# Patient Record
Sex: Male | Born: 1960 | Marital: Married | Smoking: Never smoker
Health system: Southern US, Community
[De-identification: ages and names within clinical notes are randomized; demographics above are authoritative.]

## PROBLEM LIST (undated history)

## (undated) HISTORY — PX: CARPAL TUNNEL RELEASE: SHX101

---

## 2015-08-31 ENCOUNTER — Other Ambulatory Visit: Payer: Self-pay | Admitting: Internal Medicine

## 2015-08-31 DIAGNOSIS — R350 Frequency of micturition: Secondary | ICD-10-CM

## 2015-09-06 ENCOUNTER — Ambulatory Visit
Admission: RE | Admit: 2015-09-06 | Discharge: 2015-09-06 | Disposition: A | Discharge status: Home / Self Care | Payer: BLUE CROSS/BLUE SHIELD | Source: Ambulatory Visit | Attending: Internal Medicine | Admitting: Internal Medicine

## 2015-09-06 DIAGNOSIS — R350 Frequency of micturition: Secondary | ICD-10-CM

## 2015-09-27 ENCOUNTER — Ambulatory Visit (INDEPENDENT_AMBULATORY_CARE_PROVIDER_SITE_OTHER): Payer: BLUE CROSS/BLUE SHIELD | Admitting: Neurology

## 2015-09-27 ENCOUNTER — Encounter: Payer: Self-pay | Admitting: Neurology

## 2015-09-27 VITALS — BP 128/68 | HR 77

## 2015-09-27 DIAGNOSIS — G5603 Carpal tunnel syndrome, bilateral upper limbs: Secondary | ICD-10-CM | POA: Diagnosis not present

## 2015-09-27 DIAGNOSIS — H538 Other visual disturbances: Secondary | ICD-10-CM

## 2015-09-27 DIAGNOSIS — G609 Hereditary and idiopathic neuropathy, unspecified: Secondary | ICD-10-CM

## 2015-09-27 DIAGNOSIS — G114 Hereditary spastic paraplegia: Secondary | ICD-10-CM

## 2015-09-27 NOTE — Progress Notes (Signed)
Chart forwarded.  

## 2015-09-27 NOTE — Progress Notes (Signed)
NEUROLOGY CONSULTATION NOTE  Richmond Coldren MRN: [id number] DOB: Feb 15, 1961  Referring provider: Dr. Nehemiah Settle Primary care provider: Dr. Nehemiah Settle  Reason for consult:  Hereditary spastic paraparesis  HISTORY OF PRESENT ILLNESS: Mitchell Reyes is a 55 year old right-handed man  who presents for autosomal dominant hereditary spastic paraparesis.  History obtained by patient and prior neurologist's notes.  His symptoms started when he was in his late-20s.  He was living in [place] at the time.  At first, he would occasionally limp or feel unsteady while walking.  Then the symptoms gradually worsened over the years with increased pain and stiffness in the legs.  In 2006, he started having trouble running and walking long distances.  In 2009, he was evaluated by an orthopedist, who noted clonus in his lower extremities.  He was referred to a neurologist in [place].  MRI of the brain and spine revealed no abnormalities except for some mild degenerative changes.  He began using a cane around this time, and then progressed to a walker in the house and use of a wheelchair outside.  He has a family history discussed below, on his maternal side, with similar symptoms.  He was referred to the [facility] in 2010.  Due to financial reasons, he did not have genetic testing performed, but based on family history, hereditary spastic paraparesis was suspected.  Later, he apparently had genetic testing done but never received any results.  He has severe bilateral CTS, as demonstrated on prior NCV-EMG, and has undergone release on the left.  He continues to have grip weakness but no pain or numbness. He also has blurred vision, which has progressively gotten worse over the past couple of years.  He saw an ophthalmologist who didn't find any abnormalities. He also has neuropathy. He has chronic back pain with some involvement in the legs.  Lumbar X-rays from 2009 showed chronic right L1 transverse process  fracture. He has urinary urgency. He does not have anal sphincter dysfunction or erectile dysfunction.  He has no history of seizures.  He takes gabapentin  in AM and afternoon, and  at bedtime for pain. He takes baclofen  five times daily and  at bedtime for spasticity.  He previously took tizanidine, which caused excessive drowsiness.Marland Kitchen He takes oxybutynin  twice daily for urinary urgency.  Labs from 2012 include SPEP and IFE negative, vitamin D 50, vitamin E 3.2, copper 93, TSH 2, B12 1444, methylmalonic acid 0.14, HGB A1c 5.7.  Repeat B12 from 2015 was 366.  Family history:  His mother was diagnosed with HSP, first developing symptoms in her 88s.  She has tested negative for mutations in the SPAST gene.  His mother's twin sister also was diagnosed with HSP, presenting symptoms in her 86s.  His mother's 3 other siblings were not known to have the disease.  His maternal uncle, who passed away in his 3s, was presumed to have had the disease.  His maternal grandfather who died in his 92s was suspected to have had the disease, presenting with symptoms in his 69s.  He has three daughters.  His middle daughter has spina bifida and cerebral palsy.33  PAST MEDICAL HISTORY: History reviewed. No pertinent past medical history.  PAST SURGICAL HISTORY: Past Surgical History  Procedure Laterality Date  . Carpal tunnel release Bilateral     MEDICATIONS: Baclofen  every 4 hours and  at bedtime Gabapentin  twice daily and  at bedime Oxybutynin  daily Claritin  daily  ALLERGIES:  No Known Allergies  FAMILY HISTORY: Family History  Problem Relation Age of Onset  . Stroke Maternal Grandfather     SOCIAL HISTORY: Social History   Social History  . Marital Status: Married    Spouse Name: N/A  . Number of Children: N/A  . Years of Education: N/A   Occupational History  . Not on file.   Social History Main Topics  . Smoking status: Never  Smoker   . Smokeless tobacco: Not on file  . Alcohol Use: Not on file  . Drug Use: Not on file  . Sexual Activity: Not on file   Other Topics Concern  . Not on file   Social History Narrative  . No narrative on file    REVIEW OF SYSTEMS: Constitutional: No fevers, chills, or sweats, no generalized fatigue, change in appetite Eyes: No visual changes, double vision, eye pain Ear, nose and throat: No hearing loss, ear pain, nasal congestion, sore throat Cardiovascular: No chest pain, palpitations Respiratory:  No shortness of breath at rest or with exertion, wheezes GastrointestinaI: No nausea, vomiting, diarrhea, abdominal pain, fecal incontinence Genitourinary:  urinary frequency Musculoskeletal:   back pain Integumentary: No rash, pruritus, skin lesions Neurological: as above Psychiatric: No depression, insomnia, anxiety Endocrine: No palpitations, fatigue, diaphoresis, mood swings, change in appetite, change in weight, increased thirst Hematologic/Lymphatic:  No purpura, petechiae. Allergic/Immunologic: no itchy/runny eyes, nasal congestion, recent allergic reactions, rashes  PHYSICAL EXAM: Filed Vitals:   09/27/15 0957  BP: 128/68  Pulse: 77   General: No acute distress.  Patient appears well-groomed.  Head:  Normocephalic/atraumatic Eyes:  fundi examined but not visualized Neck: supple, no paraspinal tenderness, full range of motion Back: No paraspinal tenderness Heart: regular rate and rhythm Lungs: Clear to auscultation bilaterally. Vascular: No carotid bruits. Neurological Exam: Mental status: alert and oriented to person, place, and time, recent and remote memory intact, fund of knowledge intact, attention and concentration intact, speech fluent and not dysarthric, language intact. Cranial nerves: CN I: not tested CN II: pupils equal, round and reactive to light, visual fields intact CN III, IV, VI:  full range of motion, no nystagmus, no ptosis CN V: facial  sensation intact CN VII: upper and lower face symmetric CN VIII: hearing intact CN IX, X: gag intact, uvula midline CN XI: sternocleidomastoid and trapezius muscles intact CN XII: tongue midline Bulk & Tone: Mild increased tone in lower extremities.  Bilateral APB wasting in hands, no fasciculations. Motor:  3+ bilateral hip flexion, 4+ bilateral hamstrings, 4+ left quads, and bilateral foot drop.  Otherwise, 5/5 throughout. Sensation:  Pinprick sensation and vibration sensation in feet. Deep Tendon Reflexes:  1+ in upper extremities, 2+ lower extremities with non-sustained clonus,  Bilateral Babinski Finger to nose testing:  Without dysmetria.  Heel to shin:  Unable to perform Gait:  Able to push self up from wheelchair to stand.  Wide-based stance.  Spastic gait.  IMPRESSION: 1.  Probable hereditary spastic paraparesis, given family history.  Other considerations, such as PLS or HTLV-1 associated myelopathy less likely, given this strong family history.  Prior MRIs reportedly negative for MS.  However, a definitive diagnosis cannot be made without genetic testing.  2.  Peripheral neuropathy.  Idiopathic but may be seen in some cases of HSP. 3.  Blurred vision  PLAN: 1.  Continue baclofen and gabapentin 2.  Will refer to ophthalmology regarding progressive vision loss 3.  Has appointment with urology regarding urinary frequency. 4.  Since neuromuscular diseases may cause  cardiomyopathy, will refer for cardiac evaluation. 5.  For a definitive diagnosis, we will look into genetic testing and what the potential cost would be.  Thank you for allowing me to take part in the care of this patient.  Shon MilletAdam Jaffe, DO  CC: Renford Dillsonald Polite, MD

## 2015-09-27 NOTE — Patient Instructions (Signed)
1.  We will refer you to ophthalmology for blurred vision 2.  We will refer you to cardiology for routine evaluation as people with neuromuscular disorders may develop heart issues 3.  We will look into genetic testing. 4.  Follow up in 1 year or as needed.

## 2015-09-28 ENCOUNTER — Telehealth: Payer: Self-pay

## 2015-09-28 NOTE — Telephone Encounter (Signed)
  Called insurance. No coverage for genetic testing for pt's over 53 years old. Can fax in a letter of medical necessity, but not likely to be accepted.     [email or web address]

## 2015-10-02 ENCOUNTER — Encounter: Payer: Self-pay | Admitting: Cardiovascular Disease

## 2015-10-02 ENCOUNTER — Ambulatory Visit (INDEPENDENT_AMBULATORY_CARE_PROVIDER_SITE_OTHER): Payer: BLUE CROSS/BLUE SHIELD | Admitting: Cardiovascular Disease

## 2015-10-02 VITALS — BP 110/78 | HR 77 | Ht 71.0 in | Wt 294.0 lb

## 2015-10-02 DIAGNOSIS — R0600 Dyspnea, unspecified: Secondary | ICD-10-CM

## 2015-10-02 NOTE — Progress Notes (Signed)
Cardiology Office Note   Date:  10/02/2015   ID:  Mitchell Reyes, DOB 12-09-1960, MRN [id number]  PCP:  Katy Apo, MD  Cardiologist:   Lorine Bears, MD   Chief Complaint  Patient presents with  . New Evaluation    Hereditary spastic paraparesis  . Edema    LEGS AND ANKLES      History of Present Illness: Mitchell Reyes is a 55 y.o. male who was referred by Dr. Everlena Cooper for cardiovascular evaluation.  The patient has a rare autosomal dominant hereditary paraparesis which is a neuromuscular disorder was diagnosed in 2009. He is mostly wheelchair bound due to this and he is able to use a walker. This condition can be associated with cardiomyopathy and thus he was referred for cardiac evaluation. He denies any chest pain, palpitations, dizziness or syncope. He does complain of mild exertional dyspnea with no recent worsening. He is not a smoker and does not. There is no family history of coronary artery disease, heart failure or cardiomyopathy.   History reviewed. No pertinent past medical history.  Past Surgical History  Procedure Laterality Date  . Carpal tunnel release Bilateral      Current Outpatient Prescriptions  Medication Sig Dispense Refill  . baclofen (LIORESAL) 20 MG tablet Take 20 mg by mouth every 4 (four) hours. Five during the day and 2 at bedtime    . gabapentin (NEURONTIN) 800 MG tablet Take 800 mg by mouth See admin instructions. BID and 1600 at bedtime    . loratadine (CLARITIN) 10 MG tablet Take 10 mg by mouth daily.    Marland Kitchen oxybutynin (DITROPAN) 5 MG tablet Take 10 mg by mouth 2 (two) times daily.     No current facility-administered medications for this visit.    Allergies:   Review of patient's allergies indicates no known allergies.    Social History:  The patient  reports that he has never smoked. He does not have any smokeless tobacco history on file.   Family History:  The patient's family history includes Stroke in his maternal grandfather.     ROS:  Please see the history of present illness.   Otherwise, review of systems are positive for none.   All other systems are reviewed and negative.    PHYSICAL EXAM: VS:  BP 110/78 mmHg  Pulse 77  Ht  (1.803 m)  Wt 294 lb (133.358 kg)  BMI 41.02 kg/m2 , BMI Body mass index is 41.02 kg/(m^2). GEN: Well nourished, well developed, in no acute distress HEENT: normal Neck: no JVD, carotid bruits, or masses Cardiac: RRR; no murmurs, rubs, or gallops,no edema  Respiratory:  clear to auscultation bilaterally, normal work of breathing GI: soft, nontender, nondistended, + BS MS: no deformity or atrophy Skin: warm and dry, no rash Neuro:  Strength and sensation are intact Psych: euthymic mood, full affect   EKG:  EKG is ordered today. The ekg ordered today demonstrates normal sinus rhythm. No evidence of a MI.   Recent Labs: No results found for requested labs within last 365 days.    Lipid Panel No results found for: CHOL, TRIG, HDL, CHOLHDL, VLDL, LDLCALC, LDLDIRECT    Wt Readings from Last 3 Encounters:  10/02/15 294 lb (133.358 kg)        ASSESSMENT AND PLAN:  1.  Mild exertional dyspnea: History of hereditary spastic paraparesis which can be associated with cardiomyopathy. His cardiac exam is unremarkable and baseline ECG is normal. I requested an echocardiogram to evaluate  LV systolic function. The patient otherwise has no anginal symptoms. He can follow-up as needed if cardiac testing is abnormal.    Disposition:   FU with me prn.   Signed,  Lorine BearsMuhammad Arida, MD  10/02/2015 10:39 AM    [facility]

## 2015-10-02 NOTE — Patient Instructions (Signed)
Medication Instructions:  Your physician recommends that you continue on your current medications as directed. Please refer to the Current Medication list given to you today.  Labwork: No new orders.   Testing/Procedures: Your physician has requested that you have an echocardiogram. Echocardiography is a painless test that uses sound waves to create images of your heart. It provides your doctor with information about the size and shape of your heart and how well your heart's chambers and valves are working. This procedure takes approximately one hour. There are no restrictions for this procedure.  Follow-Up: Your physician recommends that you schedule a follow-up appointment as needed with Dr Arida.    Any Other Special Instructions Will Be Listed Below (If Applicable).     If you need a refill on your cardiac medications before your next appointment, please call your pharmacy.   

## 2015-10-03 NOTE — Telephone Encounter (Signed)
Okay.  No further recommendations at this time.

## 2015-10-22 ENCOUNTER — Other Ambulatory Visit (HOSPITAL_COMMUNITY): Payer: BLUE CROSS/BLUE SHIELD

## 2015-11-09 ENCOUNTER — Telehealth: Payer: Self-pay | Admitting: Cardiovascular Disease

## 2015-11-09 NOTE — Telephone Encounter (Signed)
I spoke with the pt and made him aware that Echo was recommended to evaluate for cardiomyopathy.  Due to financial issues the pt would like to reschedule this test to September.  Echo rescheduled to 01/15/16.

## 2015-11-09 NOTE — Telephone Encounter (Signed)
New message     Patient is schedule for echo on  7.10 . Wants to know why he needs to have echo . $1500.00 he is responsible for out of pocket,.

## 2015-11-12 ENCOUNTER — Other Ambulatory Visit (HOSPITAL_COMMUNITY): Payer: BLUE CROSS/BLUE SHIELD

## 2016-01-11 ENCOUNTER — Telehealth: Payer: Self-pay | Admitting: *Deleted

## 2016-01-11 NOTE — Telephone Encounter (Signed)
Per [name],  Echocardiogram--- Cancel Rsn: Patient (patient does not want to cancel appt - moving out of the area )

## 2016-01-15 ENCOUNTER — Other Ambulatory Visit (HOSPITAL_COMMUNITY): Payer: BLUE CROSS/BLUE SHIELD

## 2016-02-08 ENCOUNTER — Encounter: Payer: Self-pay | Admitting: Neurology

## 2016-09-26 ENCOUNTER — Ambulatory Visit: Payer: BLUE CROSS/BLUE SHIELD | Admitting: Neurology

## 2016-09-30 ENCOUNTER — Ambulatory Visit: Payer: BLUE CROSS/BLUE SHIELD | Admitting: Neurology

## 2016-10-06 ENCOUNTER — Encounter: Payer: Self-pay | Admitting: Neurology

## 2017-12-07 IMAGING — US US PELVIS LIMITED
1 series · 14 of 25 positions shown · non-contrast
Comparison: None.

CLINICAL DATA: Urinary frequency and urgency, some pelvic pain

EXAM:
LIMITED ULTRASOUND OF PELVIS
TECHNIQUE: Limited transabdominal ultrasound examination of the pelvis was
performed.

[Series 1: us pelvis limited · 0.30mm/px · 14 of 31 slices shown]
[im 1/31]
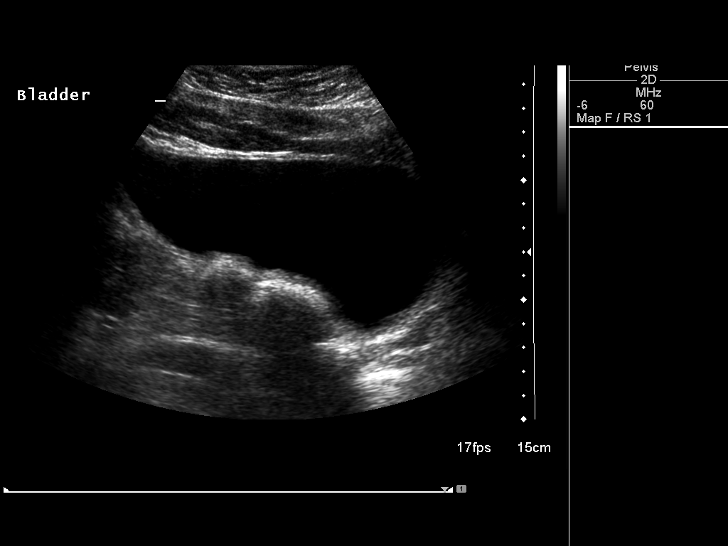
[im 3/31]
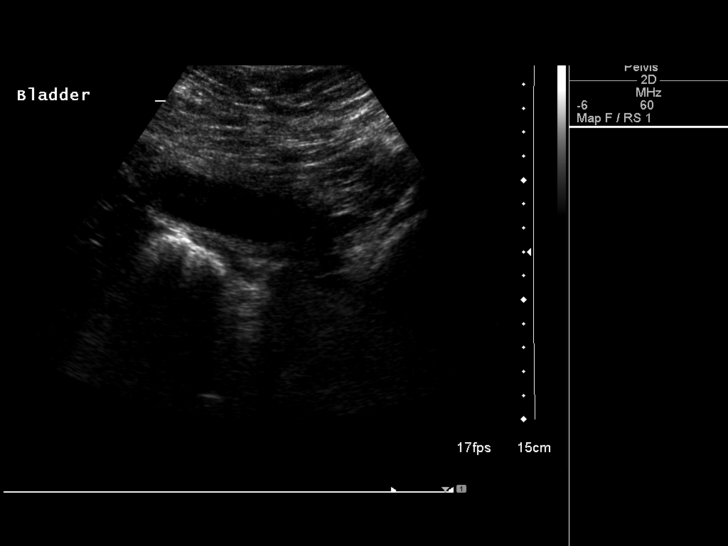
[im 6/31]
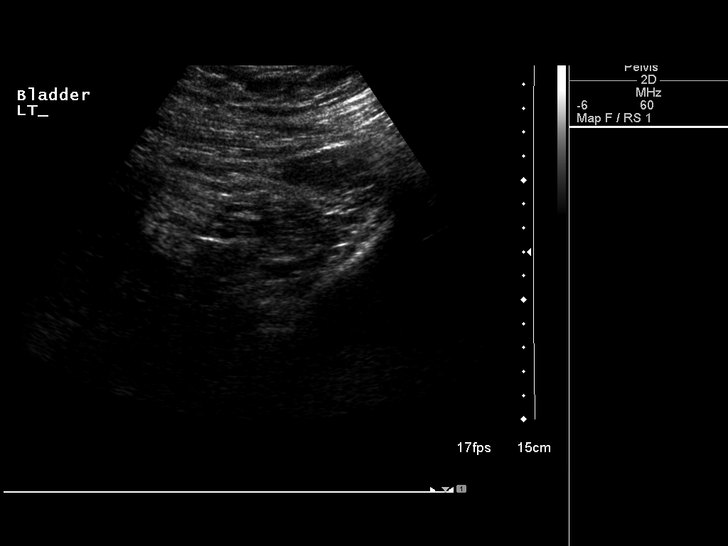
[im 8/31]
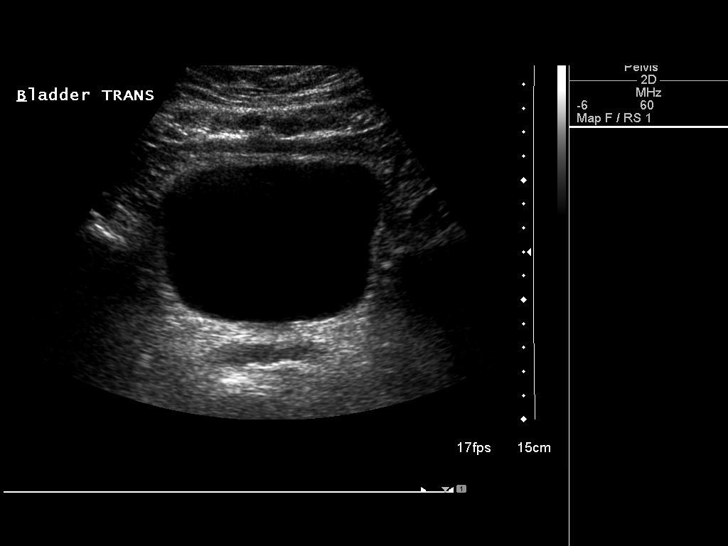
[im 11/31]
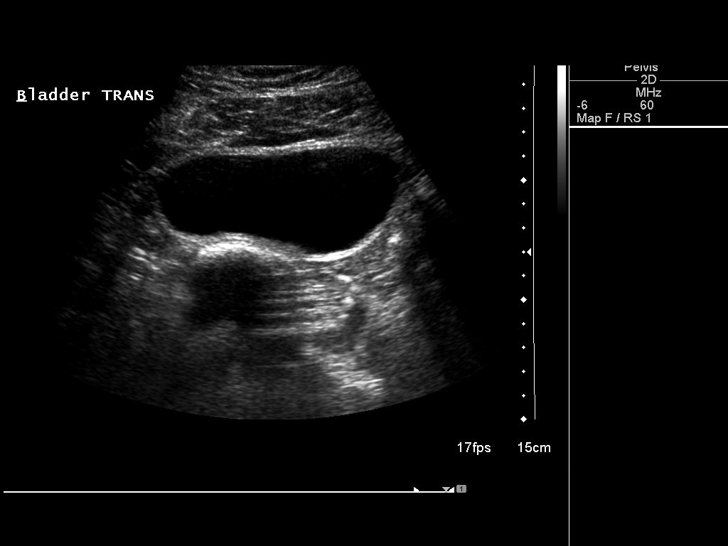
[im 12/31]
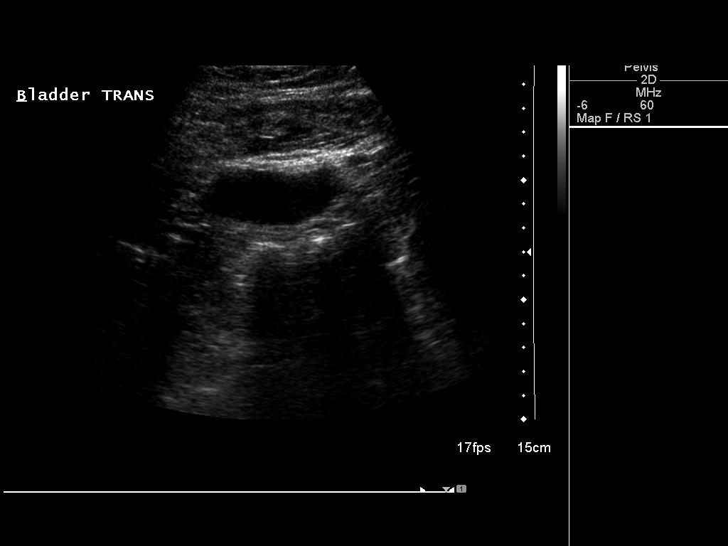
[im 14/31]
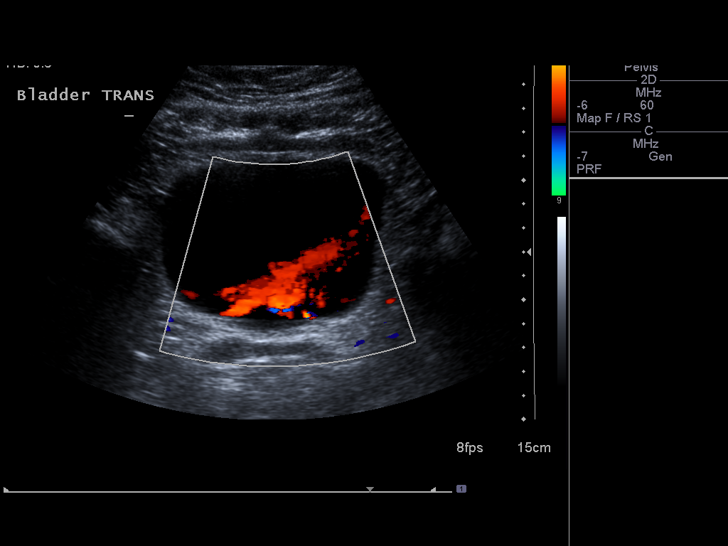
[im 17/31]
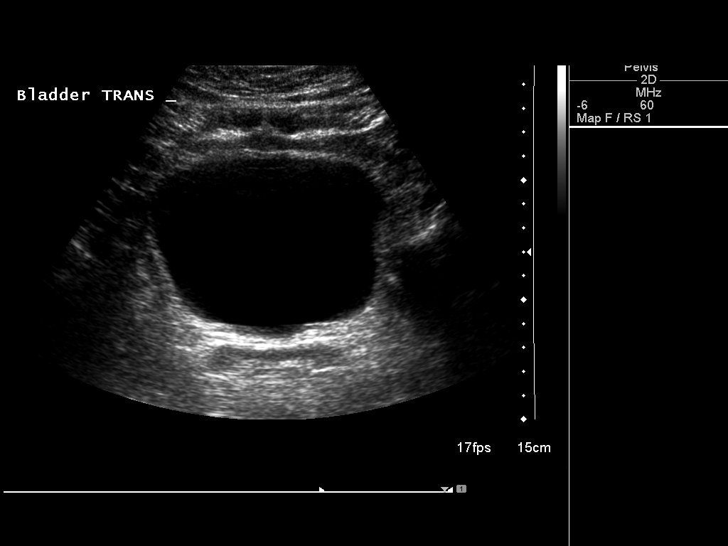
[im 19/31]
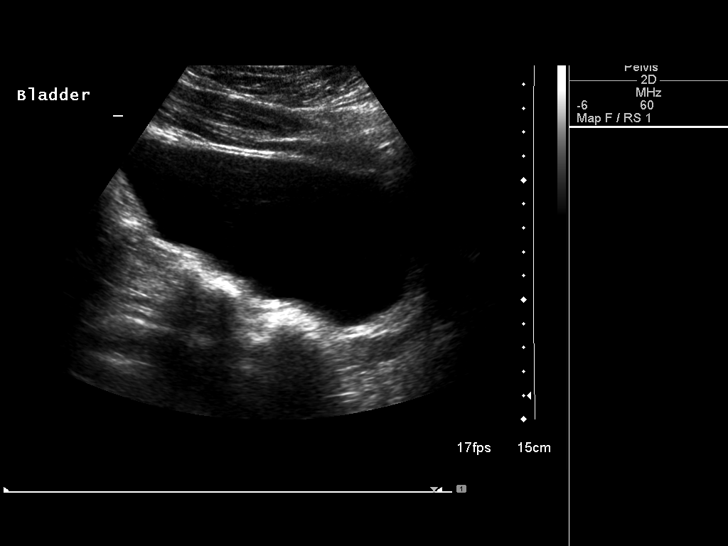
[im 21/31]
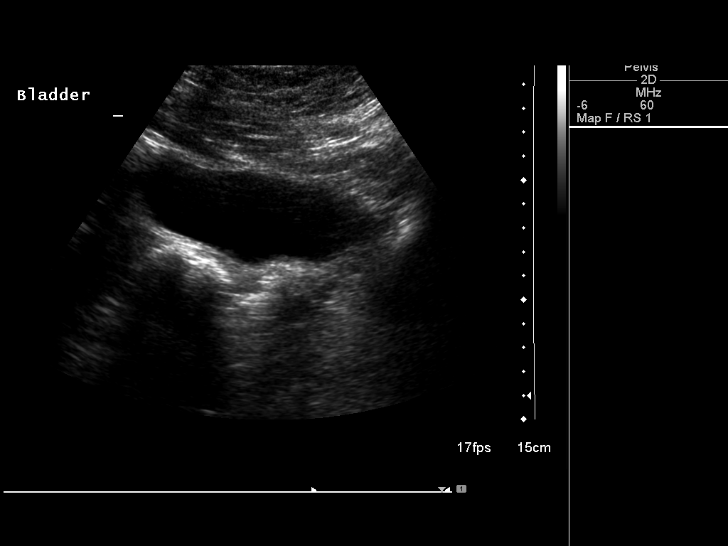
[im 23/31]
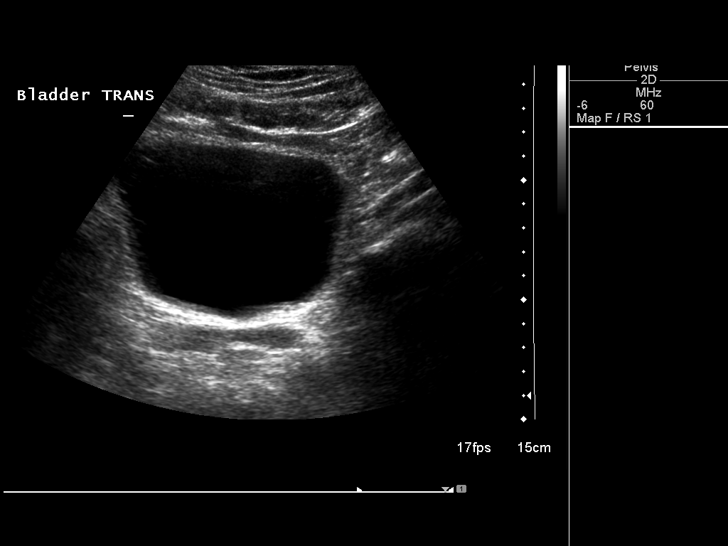
[im 26/31]
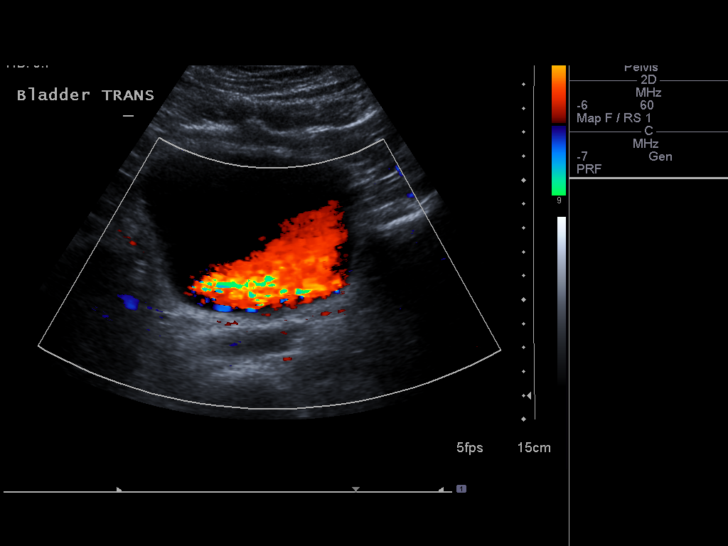
[im 28/31]
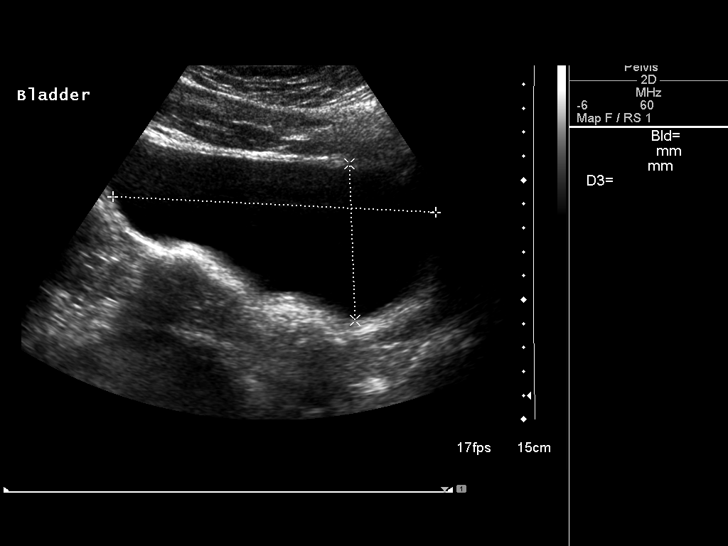
[im 31/31]
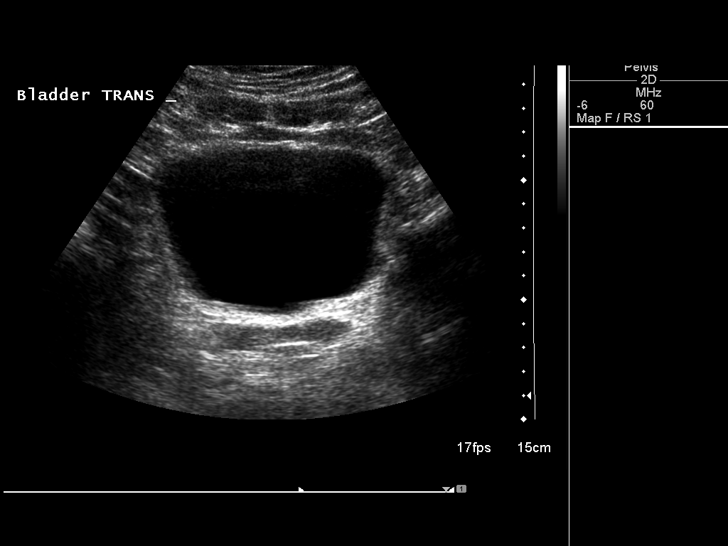

[14 of 25 positions shown; findings below may reference images not displayed]

FINDINGS: The urinary bladder is moderately urine distended. The urinary
bladder wall is not thickened and no trabeculation is seen by
ultrasound. The urinary bladder prevoid volume is 519 cubic cm with
a postvoid volume of 467 cubic cm. The prostate measures 3 cm in
diameter transversely.
IMPRESSION: 1. No evidence of neurogenic bladder.
2. There is a large post void residual urine volume present.

## 2019-07-08 ENCOUNTER — Ambulatory Visit: Payer: BLUE CROSS/BLUE SHIELD | Attending: Internal Medicine

## 2019-07-08 DIAGNOSIS — Z23 Encounter for immunization: Secondary | ICD-10-CM | POA: Insufficient documentation

## 2019-08-09 ENCOUNTER — Ambulatory Visit: Payer: BLUE CROSS/BLUE SHIELD | Attending: Internal Medicine

## 2019-08-09 DIAGNOSIS — Z23 Encounter for immunization: Secondary | ICD-10-CM

## 2019-08-09 NOTE — Progress Notes (Signed)
   Covid-19 Vaccination Clinic  Name:  Mitchell Reyes    MRN: [id number] DOB: [birth year]  08/09/2019  [name] was observed post Covid-19 immunization for 15 minutes without incident. He was provided with Vaccine Information Sheet and instruction to access the V-Safe system.   [name] was instructed to call 911 with any severe reactions post vaccine: Marland Kitchen Difficulty breathing  . Swelling of face and throat  . A fast heartbeat  . A bad rash all over body  . Dizziness and weakness   Immunizations Administered    Name Date Dose VIS Date Route   Pfizer COVID-19 Vaccine 08/09/2019  4:48 PM 0.3 mL 04/15/2019 Intramuscular   Manufacturer: [employer], [employer]   Lot: [id number]   NDC: [address]-6
